# Patient Record
Sex: Male | Born: 1955 | Race: White | Hispanic: No | Marital: Married | Smoking: Former smoker
Health system: Southern US, Community
[De-identification: ages and names within clinical notes are randomized; demographics above are authoritative.]

## PROBLEM LIST (undated history)

## (undated) DIAGNOSIS — I2510 Atherosclerotic heart disease of native coronary artery without angina pectoris: Secondary | ICD-10-CM

## (undated) DIAGNOSIS — I499 Cardiac arrhythmia, unspecified: Secondary | ICD-10-CM

## (undated) HISTORY — PX: CARDIAC SURGERY: SHX584

---

## 2013-11-09 ENCOUNTER — Other Ambulatory Visit: Payer: Self-pay | Admitting: Emergency Medicine

## 2013-11-09 ENCOUNTER — Ambulatory Visit (INDEPENDENT_AMBULATORY_CARE_PROVIDER_SITE_OTHER): Payer: Self-pay

## 2013-11-09 ENCOUNTER — Other Ambulatory Visit: Payer: Self-pay | Admitting: Adult Health

## 2013-11-09 DIAGNOSIS — M25519 Pain in unspecified shoulder: Secondary | ICD-10-CM

## 2013-11-09 DIAGNOSIS — R52 Pain, unspecified: Secondary | ICD-10-CM

## 2013-11-09 DIAGNOSIS — M25529 Pain in unspecified elbow: Secondary | ICD-10-CM

## 2013-11-09 DIAGNOSIS — M25569 Pain in unspecified knee: Secondary | ICD-10-CM

## 2014-02-14 ENCOUNTER — Other Ambulatory Visit: Payer: Self-pay | Admitting: Orthopedic Surgery

## 2014-02-14 DIAGNOSIS — M25511 Pain in right shoulder: Secondary | ICD-10-CM

## 2014-02-22 ENCOUNTER — Ambulatory Visit
Admission: RE | Admit: 2014-02-22 | Discharge: 2014-02-22 | Disposition: A | Discharge status: Home / Self Care | Payer: Worker's Compensation | Source: Ambulatory Visit | Attending: Orthopedic Surgery | Admitting: Orthopedic Surgery

## 2014-02-22 DIAGNOSIS — M25511 Pain in right shoulder: Secondary | ICD-10-CM

## 2015-05-30 ENCOUNTER — Other Ambulatory Visit: Payer: Self-pay | Admitting: Orthopedic Surgery

## 2015-05-30 DIAGNOSIS — M25511 Pain in right shoulder: Secondary | ICD-10-CM

## 2015-06-03 ENCOUNTER — Ambulatory Visit
Admission: RE | Admit: 2015-06-03 | Discharge: 2015-06-03 | Disposition: A | Discharge status: Home / Self Care | Payer: Worker's Compensation | Source: Ambulatory Visit | Attending: Orthopedic Surgery | Admitting: Orthopedic Surgery

## 2015-06-03 DIAGNOSIS — M25511 Pain in right shoulder: Secondary | ICD-10-CM

## 2016-11-27 IMAGING — MR MR SHOULDER*R* W/O CM
4 of 5 series · 19 of 40 positions shown · non-contrast
Comparison: MRI 02/22/2014

CLINICAL DATA: Fell at work in October 2013 and injured right
shoulder. Subsequent shoulder surgery but persistent pain.

EXAM:
MRI OF THE RIGHT SHOULDER WITHOUT CONTRAST
TECHNIQUE: Multiplanar, multisequence MR imaging of the shoulder was performed.
No intravenous contrast was administered.

[Series 8: T2 fat-sat · axial · 4.0mm · 0.27mm/px · z∈[-22,+46]mm · 6 of 18 slices shown]
[im 1/18]
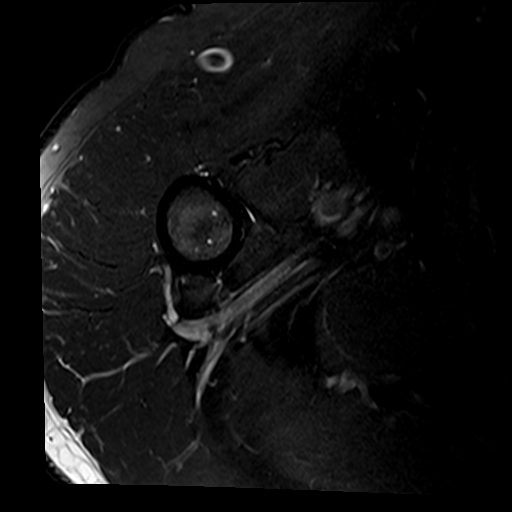
[im 3/18]
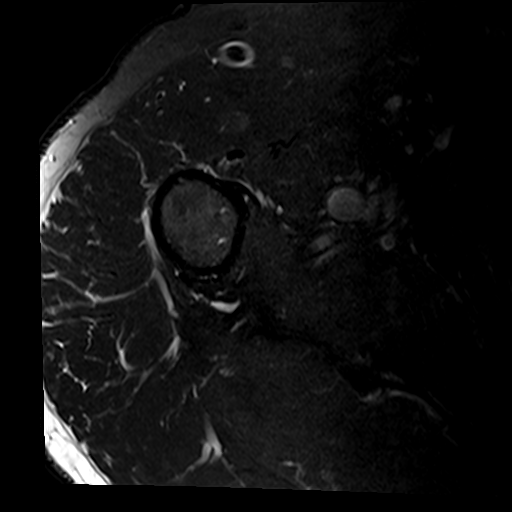
[im 5/18]
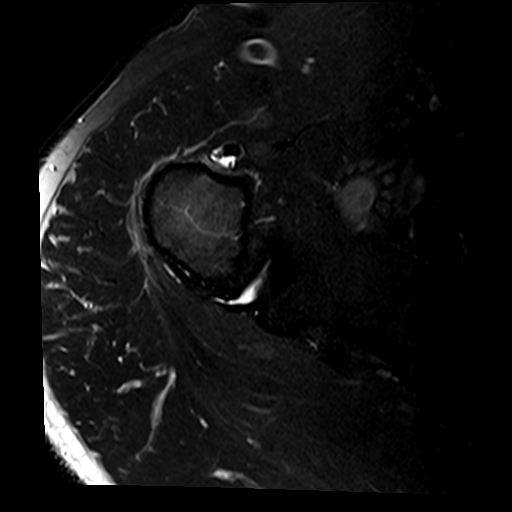
[im 7/18]
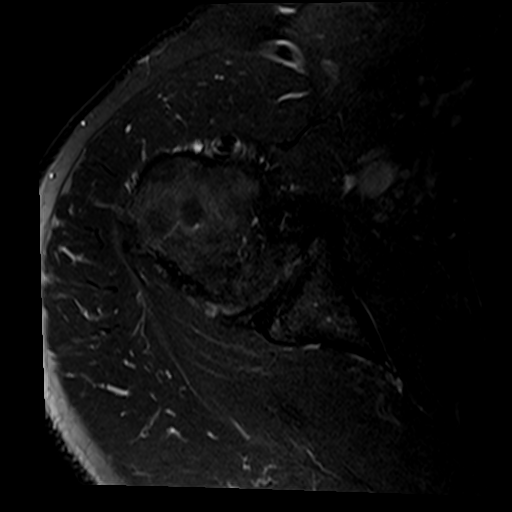
[im 9/18]
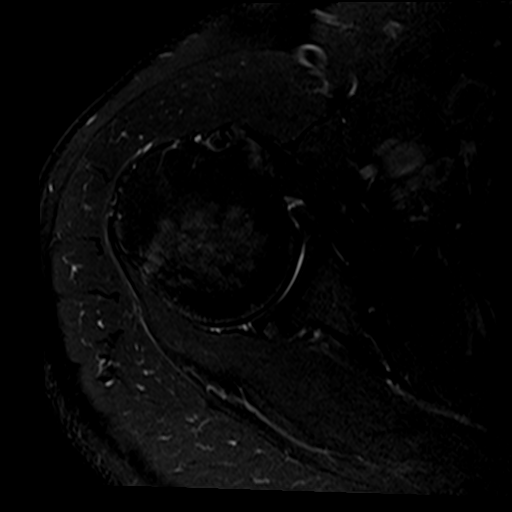
[im 15/18]
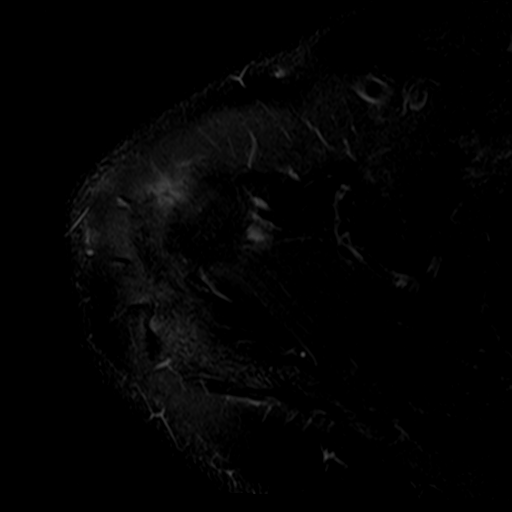

[Series 9: T2 · oblique · 4.0mm · 0.31mm/px · 3 of 16 slices shown]
[im 3/16]
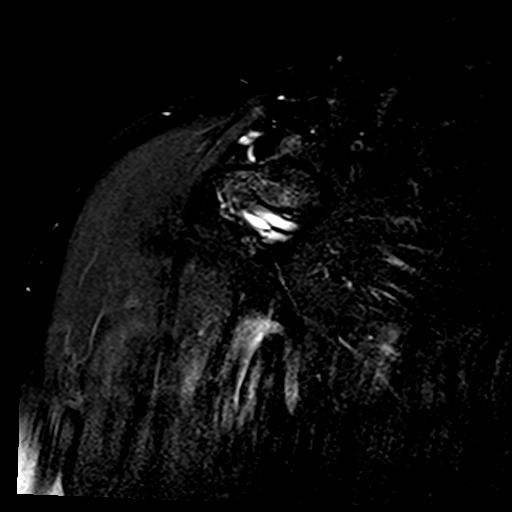
[im 9/16]
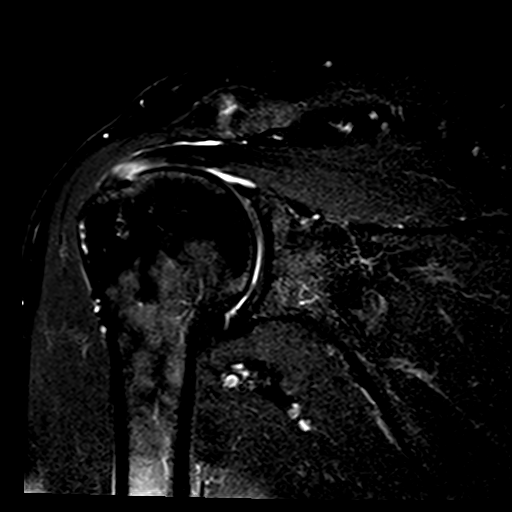
[im 13/16]
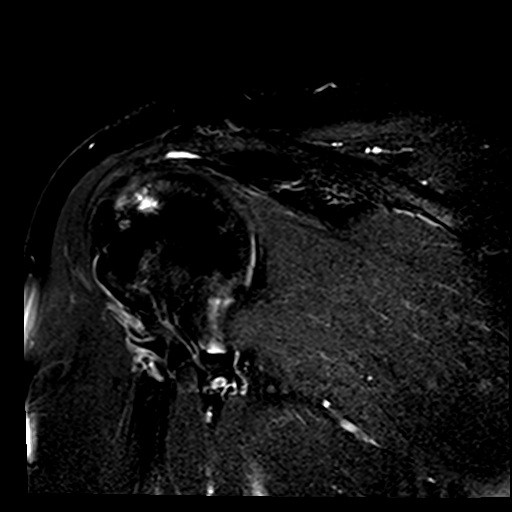

[Series 10: PD · oblique · 4.0mm · 0.31mm/px · 7 of 16 slices shown]
[im 1/16]
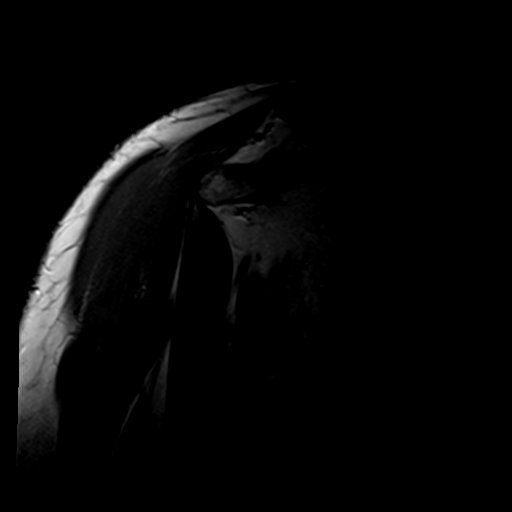
[im 3/16]
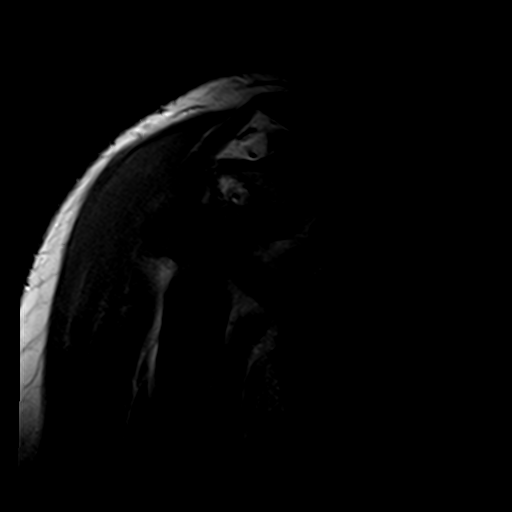
[im 6/16]
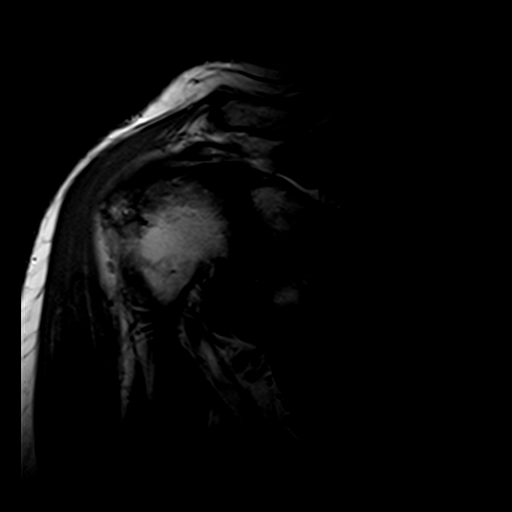
[im 8/16]
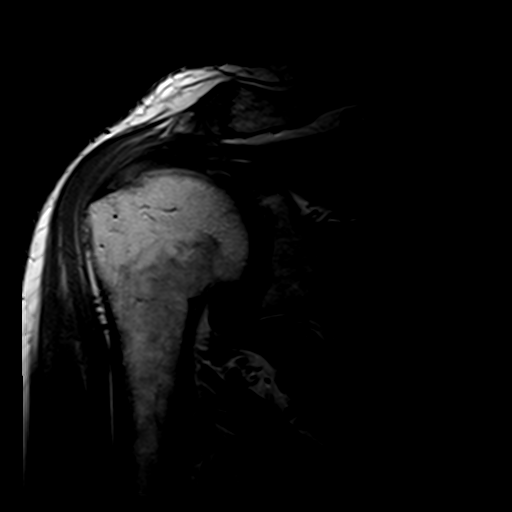
[im 11/16]
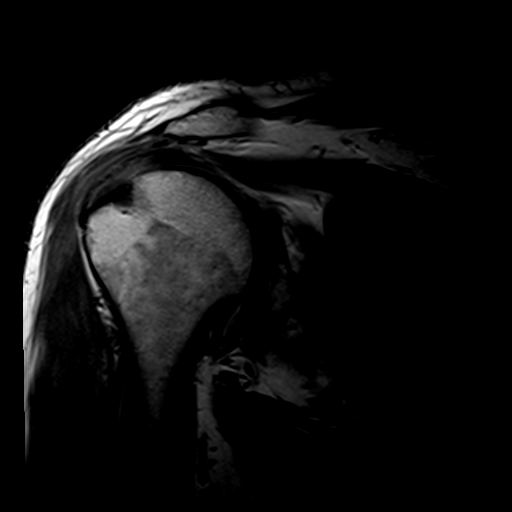
[im 13/16]
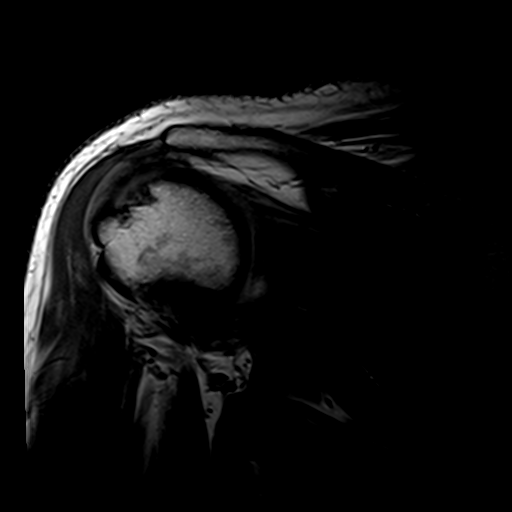
[im 16/16]
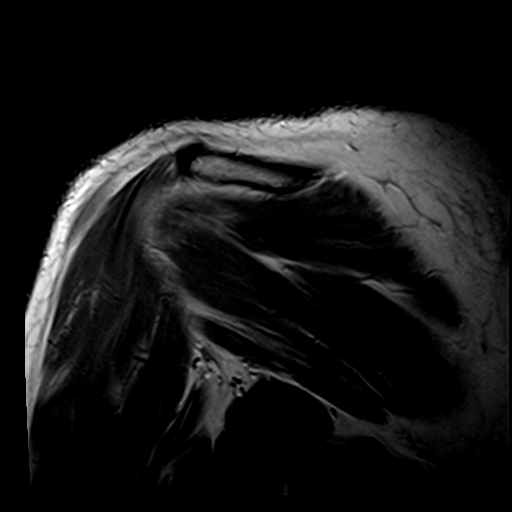

[Series 11: T1 · oblique · 4.0mm · 0.31mm/px · 3 of 18 slices shown]
[im 3/18]
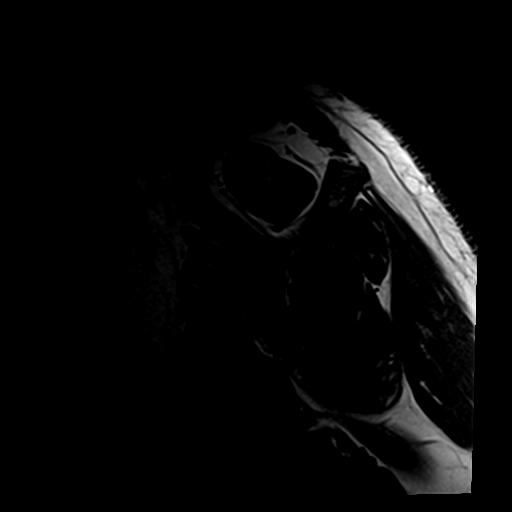
[im 10/18]
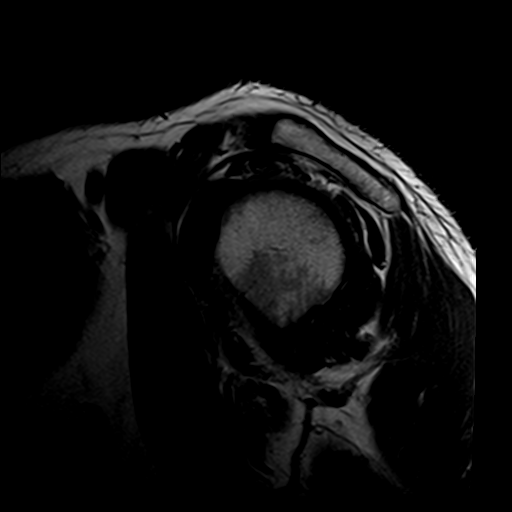
[im 15/18]
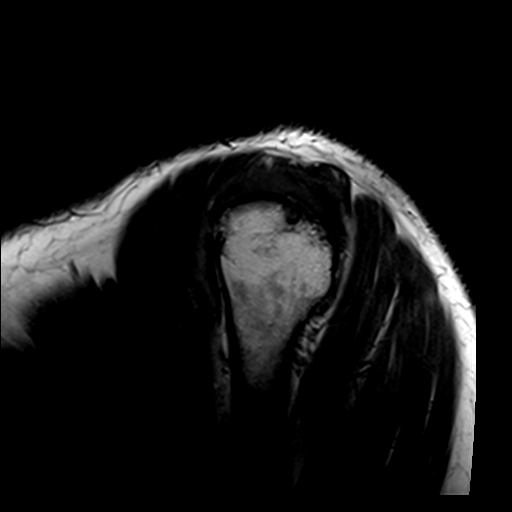

[19 of 40 positions shown; findings below may reference images not displayed]

FINDINGS: Rotator cuff: Moderate infraspinatus and supraspinatus tendinopathy/
tendinosis with shallow bursal and articular surface tears. There is
mild laminar retraction of some of the articular fibers at the
infraspinatus supraspinatus junction region. No full thickness
retracted rotator cuff tear.

Muscles:  Normal.

Biceps long head: Intact. Moderate tendinopathy involving the
intra-articular portion.

Acromioclavicular Joint: Surgical changes from bony decompressive
surgery with acromioplasty. No significant AC joint degenerative
changes and no findings for bony impingement.

Glenohumeral Joint: Moderate and progressive glenohumeral joint
degenerative changes along with a small joint effusion and mild
synovitis.

Labrum:  No obvious labral tears.

Bones:  No acute bony findings.
IMPRESSION: Moderate infraspinatus and supraspinatus tendinopathy/ tendinosis
with shallow bursal and articular surface tears mainly along the
infraspinatus supraspinatus junction region. No full thickness
retracted tear.

Surgical changes from bony decompressive surgery. No findings for
bony impingement.

Intact long head biceps tendon but moderate tendinopathy involving
the intra-articular portion.

## 2018-03-23 ENCOUNTER — Other Ambulatory Visit: Payer: Self-pay

## 2018-03-23 ENCOUNTER — Encounter: Payer: Self-pay | Admitting: Emergency Medicine

## 2018-03-23 ENCOUNTER — Emergency Department (INDEPENDENT_AMBULATORY_CARE_PROVIDER_SITE_OTHER)
Admission: EM | Admit: 2018-03-23 | Discharge: 2018-03-23 | Disposition: A | Discharge status: Home / Self Care | Source: Home / Self Care | Attending: Family Medicine | Admitting: Family Medicine

## 2018-03-23 DIAGNOSIS — S81851A Open bite, right lower leg, initial encounter: Secondary | ICD-10-CM

## 2018-03-23 DIAGNOSIS — L089 Local infection of the skin and subcutaneous tissue, unspecified: Secondary | ICD-10-CM | POA: Diagnosis not present

## 2018-03-23 DIAGNOSIS — W540XXA Bitten by dog, initial encounter: Secondary | ICD-10-CM

## 2018-03-23 HISTORY — DX: Atherosclerotic heart disease of native coronary artery without angina pectoris: I25.10

## 2018-03-23 HISTORY — DX: Cardiac arrhythmia, unspecified: I49.9

## 2018-03-23 MED ORDER — AMOXICILLIN-POT CLAVULANATE 875-125 MG PO TABS: 1.0000 | ORAL_TABLET | Freq: Two times a day (BID) | ORAL | 0 refills | Status: AC

## 2018-03-23 NOTE — ED Provider Notes (Signed)
[facility]    CSN: [id number] Arrival date & time: 03/23/18  1742     History   Chief Complaint Chief Complaint  Patient presents with  . Animal Bite    HPI Norfleet Capers is a 62 y.o. male.   HPI  Royalty Domagala is a 62 y.o. male presenting to UC with c/o infected dog bite on Right lower leg.  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  He washed the wound and started taking leftover doxycycline 100mg  twice a day, that he was prescribed in 07/2017.  The area is still red, warm at times, and painful with certain movements.  Denies fever or chills. Last tetanus about 2 years ago.  Dog is UTD on rabies vaccine.    Past Medical History:  Diagnosis Date  . Arrhythmia   . Coronary artery disease     There are no active problems to display for this patient.   Past Surgical History:  Procedure Laterality Date  . CARDIAC SURGERY         Home Medications    Prior to Admission medications   Medication Sig Start Date End Date Taking? Authorizing Provider  aspirin 81 MG chewable tablet Chew by mouth daily.   Yes [provider]  atorvastatin (LIPITOR) 20 MG tablet Take 20 mg by mouth daily.   Yes [provider]  mexiletine (MEXITIL) 150 MG capsule Take 150 mg by mouth 3 (three) times daily.   Yes [provider]  omeprazole (PRILOSEC) 10 MG capsule Take 10 mg by mouth daily.   Yes [provider]  potassium chloride (KLOR-CON) 20 MEQ packet Take by mouth 3 (three) times a week.   Yes [provider]  amoxicillin-clavulanate (AUGMENTIN) 875-125 MG tablet Take 1 tablet by mouth 2 (two) times daily. One po bid x 7 days 03/23/18   Rolla Plate    Family History History reviewed. No pertinent family history.  Social History Social History   Tobacco Use  . Smoking status: Former Games developer  . Smokeless tobacco: Never Used  Substance Use Topics  . Alcohol use: Not Currently  . Drug use: Not  on file     Allergies   Patient has no known allergies.   Review of Systems Review of Systems  Constitutional: Negative for chills and fever.  Gastrointestinal: Negative for nausea and vomiting.  Musculoskeletal: Positive for myalgias. Negative for joint swelling.  Skin: Positive for color change and wound.     Physical Exam Triage Vital Signs ED Triage Vitals  Enc Vitals Group     BP 03/23/18 1804 130/80     Pulse Rate 03/23/18 1804 72     Resp 03/23/18 1804 16     Temp 03/23/18 1804 97.9 F (36.6 C)     Temp Source 03/23/18 1804 Oral     SpO2 03/23/18 1804 99 %     Weight 03/23/18 1805 178 lb (80.7 kg)     Height 03/23/18 1805 5\' 7"  (1.702 m)     Head Circumference --      Peak Flow --      Pain Score 03/23/18 1805 3     Pain Loc --      Pain Edu? --      Excl. in GC? --    No data found.  Updated Vital Signs BP 130/80 (BP Location: Right Arm)   Pulse 72   Temp 97.9 F (36.6 C) (  Oral)   Resp 16   Ht 5\' 7"  (1.702 m)   Wt 178 lb (80.7 kg)   SpO2 99%   BMI 27.88 kg/m   Visual Acuity Right Eye Distance:   Left Eye Distance:   Bilateral Distance:    Right Eye Near:   Left Eye Near:    Bilateral Near:     Physical Exam  Constitutional: He is oriented to person, place, and time. He appears well-developed and well-nourished. No distress.  HENT:  Head: Normocephalic and atraumatic.  Mouth/Throat: Oropharynx is clear and moist.  Eyes: EOM are normal.  Neck: Normal range of motion.  Cardiovascular: Normal rate.  Pulmonary/Chest: Effort normal. No respiratory distress.  Musculoskeletal: Normal range of motion. He exhibits tenderness.  Right leg: full ROM, tenderness to calf (see skin exam) calf is soft.   Neurological: He is alert and oriented to person, place, and time.  Skin: Skin is warm and dry. Capillary refill takes less than 2 seconds. He is not diaphoretic.     Right lower leg, lateral aspect: 4x5cm wound with 0.5cm surrounding erythema. Tender  to touch.   Psychiatric: He has a normal mood and affect. His behavior is normal.  Nursing note and vitals reviewed.    UC Treatments / Results  Labs (all labs ordered are listed, but only abnormal results are displayed) Labs Reviewed - No data to display  EKG None  Radiology No results found.  Procedures Procedures (including critical care time)  Medications Ordered in UC Medications - No data to display  Initial Impression / Assessment and Plan / UC Course  I have reviewed the triage vital signs and the nursing notes.  Pertinent labs & imaging results that were available during my care of the patient were reviewed by me and considered in my medical decision making (see chart for details).    Hx and exam c/w infected dog bite.  Will start pt on Augmentin.  Final Clinical Impressions(s) / UC Diagnoses   Final diagnoses:  Infected dog bite of lower leg, right, initial encounter     Discharge Instructions      Keep wound clean with warm water and soap.  You may cover with a bandage to keep clean and protected.  Please take the antibiotic amoxicillin-clavulanate (Augmentin) as prescribed and be sure to complete entire course even if you start to feel better to ensure infection does not come back.     ED Prescriptions    Medication Sig Dispense Auth. Provider   amoxicillin-clavulanate (AUGMENTIN) 875-125 MG tablet Take 1 tablet by mouth 2 (two) times daily. One po bid x 7 days 14 tablet Lurene ShadowPhelps, Takao Lizer O, [place]-C     Controlled Substance Prescriptions [place] Controlled Substance Registry consulted? Not Applicable   Rolla Platehelps, Taariq Leitz O, PA-C 03/23/18 [id number]

## 2018-03-23 NOTE — ED Triage Notes (Signed)
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 He began taking doxycycline 100mg  po bid the next day; this was left over from rx given in 11/18; he has been washing area with antibacterial soap daily. The site is warm, slightly reddened, and hurts when twisting motion of leg. He has proof from owner of dog that rabies vaccine up to date.

## 2018-03-23 NOTE — Discharge Instructions (Signed)
°  Keep wound clean with warm water and soap.  You may cover with a bandage to keep clean and protected.  Please take the antibiotic amoxicillin-clavulanate (Augmentin) as prescribed and be sure to complete entire course even if you start to feel better to ensure infection does not come back.
